# Patient Record
Sex: Female | Born: 1980 | Race: Black or African American | Hispanic: No | Marital: Single | Smoking: Former smoker
Health system: Southern US, Community
[De-identification: ages and names within clinical notes are randomized; demographics above are authoritative.]

## PROBLEM LIST (undated history)

## (undated) DIAGNOSIS — R011 Cardiac murmur, unspecified: Secondary | ICD-10-CM

## (undated) HISTORY — PX: WISDOM TOOTH EXTRACTION: SHX21

## (undated) HISTORY — DX: Cardiac murmur, unspecified: R01.1

## (undated) HISTORY — PX: NO PAST SURGERIES: SHX2092

---

## 2009-03-30 DIAGNOSIS — G35 Multiple sclerosis: Secondary | ICD-10-CM | POA: Insufficient documentation

## 2009-03-30 HISTORY — DX: Multiple sclerosis: G35

## 2013-01-04 DIAGNOSIS — F330 Major depressive disorder, recurrent, mild: Secondary | ICD-10-CM

## 2013-01-04 DIAGNOSIS — G479 Sleep disorder, unspecified: Secondary | ICD-10-CM

## 2013-01-04 DIAGNOSIS — F32A Depression, unspecified: Secondary | ICD-10-CM | POA: Insufficient documentation

## 2013-01-04 HISTORY — DX: Major depressive disorder, recurrent, mild: F33.0

## 2013-01-04 HISTORY — DX: Sleep disorder, unspecified: G47.9

## 2013-01-04 HISTORY — DX: Depression, unspecified: F32.A

## 2016-12-08 DIAGNOSIS — F319 Bipolar disorder, unspecified: Secondary | ICD-10-CM

## 2016-12-08 DIAGNOSIS — E039 Hypothyroidism, unspecified: Secondary | ICD-10-CM

## 2016-12-08 DIAGNOSIS — G2581 Restless legs syndrome: Secondary | ICD-10-CM | POA: Insufficient documentation

## 2016-12-08 HISTORY — DX: Bipolar disorder, unspecified: F31.9

## 2016-12-08 HISTORY — DX: Hypothyroidism, unspecified: E03.9

## 2016-12-08 HISTORY — DX: Restless legs syndrome: G25.81

## 2017-02-12 DIAGNOSIS — F419 Anxiety disorder, unspecified: Secondary | ICD-10-CM

## 2017-02-12 DIAGNOSIS — G43009 Migraine without aura, not intractable, without status migrainosus: Secondary | ICD-10-CM | POA: Insufficient documentation

## 2017-02-12 HISTORY — DX: Anxiety disorder, unspecified: F41.9

## 2017-02-12 HISTORY — DX: Migraine without aura, not intractable, without status migrainosus: G43.009

## 2017-04-29 DIAGNOSIS — R42 Dizziness and giddiness: Secondary | ICD-10-CM | POA: Insufficient documentation

## 2017-04-29 HISTORY — DX: Dizziness and giddiness: R42

## 2017-05-27 DIAGNOSIS — R258 Other abnormal involuntary movements: Secondary | ICD-10-CM | POA: Insufficient documentation

## 2017-05-27 DIAGNOSIS — R251 Tremor, unspecified: Secondary | ICD-10-CM | POA: Insufficient documentation

## 2017-05-27 HISTORY — DX: Other abnormal involuntary movements: R25.8

## 2017-05-27 HISTORY — DX: Tremor, unspecified: R25.1

## 2017-06-24 DIAGNOSIS — M5030 Other cervical disc degeneration, unspecified cervical region: Secondary | ICD-10-CM

## 2017-06-24 DIAGNOSIS — M5020 Other cervical disc displacement, unspecified cervical region: Secondary | ICD-10-CM | POA: Insufficient documentation

## 2017-06-24 HISTORY — DX: Other cervical disc degeneration, unspecified cervical region: M50.30

## 2017-10-05 DIAGNOSIS — M5136 Other intervertebral disc degeneration, lumbar region: Secondary | ICD-10-CM

## 2017-10-05 DIAGNOSIS — G8929 Other chronic pain: Secondary | ICD-10-CM | POA: Insufficient documentation

## 2017-10-05 DIAGNOSIS — M25561 Pain in right knee: Secondary | ICD-10-CM | POA: Insufficient documentation

## 2017-10-05 DIAGNOSIS — M25562 Pain in left knee: Secondary | ICD-10-CM

## 2017-10-05 HISTORY — DX: Other intervertebral disc degeneration, lumbar region: M51.36

## 2017-10-05 HISTORY — DX: Pain in right knee: M25.562

## 2017-10-05 HISTORY — DX: Other chronic pain: G89.29

## 2018-11-18 ENCOUNTER — Other Ambulatory Visit: Payer: Self-pay | Admitting: Orthopedic Surgery

## 2018-11-18 DIAGNOSIS — M545 Low back pain, unspecified: Secondary | ICD-10-CM

## 2018-11-18 DIAGNOSIS — G8929 Other chronic pain: Secondary | ICD-10-CM

## 2018-12-02 ENCOUNTER — Inpatient Hospital Stay
Admission: RE | Admit: 2018-12-02 | Discharge: 2018-12-02 | Disposition: A | Discharge status: Home / Self Care | Payer: Self-pay | Source: Ambulatory Visit | Attending: Orthopedic Surgery | Admitting: Orthopedic Surgery

## 2018-12-02 NOTE — Discharge Instructions (Signed)

## 2018-12-09 ENCOUNTER — Ambulatory Visit
Admission: RE | Admit: 2018-12-09 | Discharge: 2018-12-09 | Disposition: A | Discharge status: Home / Self Care | Payer: Self-pay | Source: Ambulatory Visit | Attending: Orthopedic Surgery | Admitting: Orthopedic Surgery

## 2018-12-09 ENCOUNTER — Other Ambulatory Visit: Payer: Self-pay | Admitting: Orthopedic Surgery

## 2018-12-09 DIAGNOSIS — M545 Low back pain, unspecified: Secondary | ICD-10-CM

## 2018-12-09 DIAGNOSIS — G8929 Other chronic pain: Secondary | ICD-10-CM

## 2018-12-09 NOTE — Discharge Instructions (Signed)

## 2018-12-12 ENCOUNTER — Ambulatory Visit
Admission: RE | Admit: 2018-12-12 | Discharge: 2018-12-12 | Disposition: A | Discharge status: Home / Self Care | Payer: Medicaid Other | Source: Ambulatory Visit | Attending: Orthopedic Surgery | Admitting: Orthopedic Surgery

## 2018-12-12 ENCOUNTER — Other Ambulatory Visit: Payer: Self-pay

## 2018-12-12 DIAGNOSIS — G8929 Other chronic pain: Secondary | ICD-10-CM

## 2018-12-12 MED ORDER — METHYLPREDNISOLONE ACETATE 40 MG/ML INJ SUSP (RADIOLOG
120.0000 mg | Freq: Once | INTRAMUSCULAR | Status: AC
  Administered 2018-12-12: 09:00:00 120 mg via INTRALESIONAL

## 2018-12-12 MED ORDER — IOPAMIDOL (ISOVUE-M 200) INJECTION 41%
1.0000 mL | Freq: Once | INTRAMUSCULAR | Status: AC
  Administered 2018-12-12: 09:00:00 1 mL

## 2018-12-12 MED ORDER — CEFAZOLIN SODIUM-DEXTROSE 2-4 GM/100ML-% IV SOLN
2.0000 g | Freq: Once | INTRAVENOUS | Status: AC
  Administered 2018-12-12: 2 g via INTRAVENOUS

## 2019-11-22 DIAGNOSIS — F411 Generalized anxiety disorder: Secondary | ICD-10-CM | POA: Insufficient documentation

## 2019-11-22 HISTORY — DX: Generalized anxiety disorder: F41.1

## 2020-11-04 ENCOUNTER — Ambulatory Visit (INDEPENDENT_AMBULATORY_CARE_PROVIDER_SITE_OTHER): Payer: Medicaid Other | Admitting: Podiatry

## 2020-11-04 DIAGNOSIS — Z5329 Procedure and treatment not carried out because of patient's decision for other reasons: Secondary | ICD-10-CM

## 2020-11-14 ENCOUNTER — Other Ambulatory Visit: Payer: Self-pay

## 2020-11-14 ENCOUNTER — Ambulatory Visit: Payer: Medicaid Other | Admitting: Podiatry

## 2020-11-14 ENCOUNTER — Encounter: Payer: Self-pay | Admitting: Podiatry

## 2020-11-14 DIAGNOSIS — B353 Tinea pedis: Secondary | ICD-10-CM | POA: Diagnosis not present

## 2020-11-14 DIAGNOSIS — B351 Tinea unguium: Secondary | ICD-10-CM | POA: Diagnosis not present

## 2020-11-14 MED ORDER — FLUCONAZOLE 150 MG PO TABS: 150.0000 mg | ORAL_TABLET | ORAL | 2 refills | Status: DC

## 2020-11-14 NOTE — Progress Notes (Signed)
  Subjective:  Patient ID: Karen Leonard, female    DOB: [birth year],  MRN: [id number]  Chief Complaint  Patient presents with   Nail Problem    I have some toenails that are thick and discolored and there is some itchy in between the toes    40 y.o. female presents with the above complaint. History confirmed with patient.   Objective:  Physical Exam: warm, good capillary refill, no trophic changes or ulcerative lesions, normal DP and PT pulses, and normal sensory exam  Right Foot: right hallux nail dystrophy, brown discoloration, transverse ridging. Xerosis with scaling plantar foot  Macerated webspaces 2-4 bilaterally    Assessment:   1. Tinea pedis of both feet   2. Onychomycosis      Plan:  Patient was evaluated and treated and all questions answered.  Tinea Pedis/Onychomycosis -Educated on etiology -Likely traumatic permanent changes to right hallux. Debrided today -Recc topical isopropyl alcohol to interspaces daily to keep them dry. Castellani's paint applied today. -Start fluconazole weekly.  Return in about 2 months (around 01/14/2021).

## 2020-12-18 ENCOUNTER — Telehealth: Payer: Self-pay | Admitting: Podiatry

## 2020-12-18 ENCOUNTER — Other Ambulatory Visit: Payer: Self-pay | Admitting: Sports Medicine

## 2020-12-18 MED ORDER — FLUCONAZOLE 150 MG PO TABS: 150.0000 mg | ORAL_TABLET | ORAL | 2 refills | Status: DC

## 2020-12-18 NOTE — Progress Notes (Signed)
Reordered Fluconazole for patient

## 2020-12-18 NOTE — Telephone Encounter (Signed)
Pt was given rx for fluconazole and states she lost it-would like to know if this can be re-written.  Dr. Samuella Cota pt

## 2020-12-19 NOTE — Telephone Encounter (Signed)
Lm to notify pt/reb 

## 2021-01-20 ENCOUNTER — Other Ambulatory Visit: Payer: Self-pay

## 2021-01-20 ENCOUNTER — Ambulatory Visit: Payer: Medicaid Other | Admitting: Podiatry

## 2021-01-20 DIAGNOSIS — B353 Tinea pedis: Secondary | ICD-10-CM

## 2021-01-20 DIAGNOSIS — B351 Tinea unguium: Secondary | ICD-10-CM

## 2021-01-20 MED ORDER — FLUCONAZOLE 150 MG PO TABS: 150.0000 mg | ORAL_TABLET | ORAL | 0 refills | Status: DC

## 2021-01-20 NOTE — Progress Notes (Signed)
  Subjective:  Patient ID: Karen Leonard, female    DOB: [birth year],  MRN: [id number]  Chief Complaint  Patient presents with   Tinea Pedis    F/U BL tinea and Rt 1st fungus -pt states," much better inb/w toes and nail looks same." -pt completed fluconazole    40 y.o. female presents with the above complaint. History confirmed with patient.   Objective:  Physical Exam: warm, good capillary refill, no trophic changes or ulcerative lesions, normal DP and PT pulses, and normal sensory exam  Right Foot: right hallux nail dystrophy, brown discoloration, transverse ridging but proximal clearing. Xerosis with scaling plantar foot  Macerated webspace left 4th interspace  Assessment:   1. Onychomycosis   2. Tinea pedis of both feet    Plan:  Patient was evaluated and treated and all questions answered.  Tinea Pedis/Onychomycosis -Educated on etiology -Right hallux is improving. Interdigital component almost resolved. Continue fluconazole for total 3 months of therapy. Refilled today. Debrided nail   No follow-ups on file.

## 2021-02-08 IMAGING — XA DG DISKOGRAPHY LUMBAR S+I
2 series · 2 of 2 positions shown · non-contrast
Comparison: Outside MRI

CLINICAL DATA: Degenerative disc disease at L5-S1 with low back
pain. Clinical request for steroid and anesthetic injection.

EXAM:
L5-S1 LUMBAR DISK INJECTION

[Series 1: ortho adipose · 1 of 1 slices shown (1 of 2)]
[im 1/1]
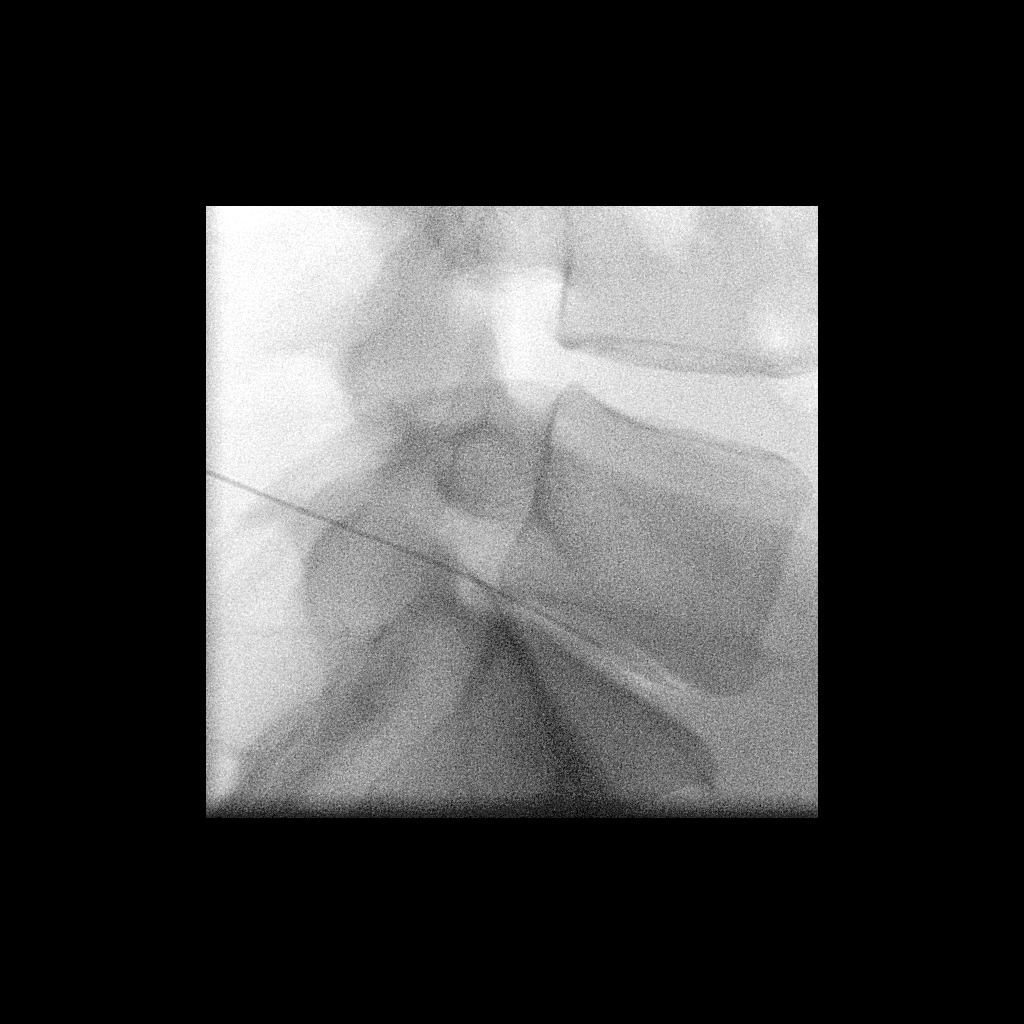

[Series 2: ortho adipose · 1 of 1 slices shown (2 of 2)]
[im 1/1]
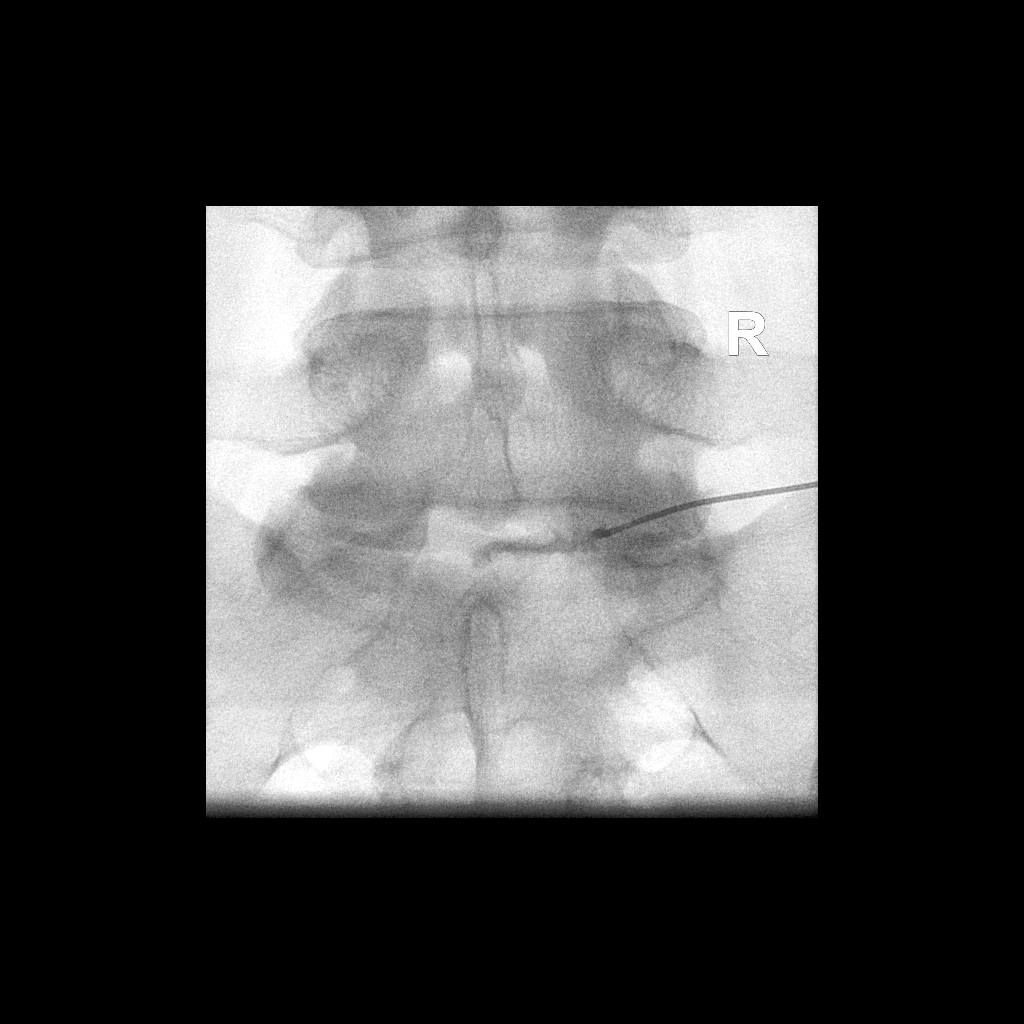

[2 of 2 positions shown; findings below may reference images not displayed]

PROCEDURE:
The procedure was discussed in depth with the patient including the
potential risk of infection. She received 1 gram of cefazolin
intravenously prior to the procedure. She was placed prone on the
fluoroscopic table. A Betadine scrub of the low back was performed
and the patient was draped in a sterile fashion. Skin anesthesia was
carried [DATE]%Lidocaine. Using a right sided oblique approach,
a 20 cm 22 gauge Chiba needle was directed into the nuclear region
of the disc at L5-S1. A few drops of Isovue 200 was injected to
confirm nuclear position. 1.5 cc consisting of 60 mg Depo-Medrol and
0.75 cc 0.5% bupivacaine were injected.

FLUOROSCOPY TIME:  1 minutes 4 seconds. 86.38 micro gray meter
squared
IMPRESSION: Technically successful L5-S1 disc injection with Depo-Medrol and
bupivacaine as above.

## 2021-02-24 ENCOUNTER — Ambulatory Visit (INDEPENDENT_AMBULATORY_CARE_PROVIDER_SITE_OTHER): Payer: Medicaid Other | Admitting: Podiatry

## 2021-02-24 DIAGNOSIS — Z91199 Patient's noncompliance with other medical treatment and regimen due to unspecified reason: Secondary | ICD-10-CM

## 2021-10-23 ENCOUNTER — Encounter: Payer: Self-pay | Admitting: Cardiology

## 2021-10-23 ENCOUNTER — Encounter: Payer: Self-pay | Admitting: *Deleted

## 2021-10-31 ENCOUNTER — Ambulatory Visit: Payer: Medicaid Other | Admitting: Cardiology

## 2021-11-19 ENCOUNTER — Ambulatory Visit: Payer: Medicaid Other | Admitting: Cardiology

## 2021-11-19 ENCOUNTER — Encounter: Payer: Self-pay | Admitting: Cardiology

## 2021-11-19 ENCOUNTER — Ambulatory Visit (INDEPENDENT_AMBULATORY_CARE_PROVIDER_SITE_OTHER): Payer: Medicaid Other

## 2021-11-19 VITALS — BP 108/72 | HR 72 | Ht 62.0 in | Wt 145.8 lb

## 2021-11-19 DIAGNOSIS — E782 Mixed hyperlipidemia: Secondary | ICD-10-CM

## 2021-11-19 DIAGNOSIS — R011 Cardiac murmur, unspecified: Secondary | ICD-10-CM

## 2021-11-19 DIAGNOSIS — R002 Palpitations: Secondary | ICD-10-CM

## 2021-11-19 DIAGNOSIS — R079 Chest pain, unspecified: Secondary | ICD-10-CM

## 2021-11-19 HISTORY — DX: Mixed hyperlipidemia: E78.2

## 2021-11-19 HISTORY — DX: Palpitations: R00.2

## 2021-11-19 HISTORY — DX: Chest pain, unspecified: R07.9

## 2021-11-19 NOTE — Progress Notes (Signed)
Cardiology Office Note:    Date:  11/19/2021   ID:  Karen Leonard, DOB [birth year], MRN [id number]  PCP:  Harlen Labs, NP  Cardiologist:  Garwin Brothers, MD   Referring MD: Harlen Labs, NP    ASSESSMENT:    1. Heart murmur   2. Palpitations   3. Mixed dyslipidemia   4. Chest pain of uncertain etiology    PLAN:    In order of problems listed above:  Primary prevention stressed with the patient.  Importance of compliance with diet and medication stressed and she vocalized understanding. Chest pain: Atypical in nature she wants to be evaluated and we will do an exercise stress echo.  She is agreeable.  She has multiple risk factors for coronary artery disease. Palpitations: Unclear about the etiology and 2-week monitor will help assess this.  She gets palpitations every 2 to 3 days so hopefully this will be helpful.  She tells me that her thyroid function is normal.  I also reviewed [facility] hospital records. Mixed dyslipidemia: Diet was emphasized and diet sheet was given for low-cholesterol diet.  Risks explained.  Her she had questions which were answered to her satisfaction. Patient will be seen in follow-up appointment in 6 months or earlier if the patient has any concerns    Medication Adjustments/Labs and Tests Ordered: Current medicines are reviewed at length with the patient today.  Concerns regarding medicines are outlined above.  Orders Placed This Encounter  Procedures   LONG TERM MONITOR (3-14 DAYS)   EKG 12-Lead   ECHOCARDIOGRAM STRESS TEST   No orders of the defined types were placed in this encounter.    History of Present Illness:    Karen Leonard is a 41 y.o. female who is being seen today for the evaluation of palpitations and cardiac murmur at the request of Harlen Labs, NP.  Patient is a pleasant 41 year old female.  She has past medical history of palpitations.  She mentions to me that she had an episode of syncope on Mother's  Day.  Subsequently she has never had any problems with that.  She is followed by primary care for this.  Today she is here because of palpitations.  She wants to be evaluated.  No dizzy spells or syncope or palpitations.  No chest pain orthopnea or PND.  At the time of my evaluation, the patient is alert awake oriented and in no distress.  She occasionally has chest pain but this is not related to exertion.  She is concerned about it and wants evaluation.  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   Multiple Vitamin (MULTI-VITAMIN PO) Take 1 tablet by mouth daily at 12 noon.   Probiotic Product (PROBIOTIC DAILY PO) Take 1 tablet by mouth daily at 12 noon.   UNABLE TO FIND C Moss Med Name: 1-2 tablets daily     Allergies:   Aripiprazole, Glatiramer, Nickel, Codeine, Fingolimod, Lithium, and Ropinirole  Social History   Socioeconomic History   Marital status: Single    Spouse name: Not on file   Number of children: Not on file   Years of education: Not on file   Highest education level: Not on file  Occupational History   Not on file  Tobacco Use   Smoking status: Former    Types: Cigarettes    Quit date: 2016    Years since quitting: 7.6    Passive exposure: Past   Smokeless tobacco: Never  Vaping Use   Vaping Use: Former  Substance and Sexual Activity   Alcohol use: Not Currently   Drug use: Not Currently   Sexual activity: Not on  file  Other Topics Concern   Not on file  Social History Narrative   Not on file   Social Determinants of Health   Financial Resource Strain: Not on file  Food Insecurity: Not on file  Transportation Needs: Not on file  Physical Activity: Not on file  Stress: Not on file  Social Connections: Not on file     Family History: The patient's family history includes Anxiety disorder in her maternal grandmother and mother; Cancer in her mother; Coronary artery disease in her father; Depression in her mother; Healthy in her sister; Heart attack in her father and paternal grandmother; Heart failure in her maternal grandmother; Migraines in her mother; Parkinsonism in her paternal uncle; Stroke in her maternal aunt, maternal uncle, paternal aunt, paternal grandfather, and paternal uncle; Thyroid disease in her sister.  ROS:   Please see the history of present illness.    All other systems reviewed and are negative.  EKGs/Labs/Other Studies Reviewed:    The following studies were reviewed today: EKG reveals sinus rhythm and nonspecific ST-T changes   Recent Labs: No results found for requested labs within last 365 days.  Recent Lipid Panel No results found for: "CHOL", "TRIG", "HDL", "CHOLHDL", "VLDL", "LDLCALC", "LDLDIRECT"  Physical Exam:    VS:  BP 108/72 (BP Location: Left Arm, Patient Position: Sitting)   Pulse 72   Ht 5\' 2"  (1.575 m)   Wt 145 lb 12.8 oz (66.1 kg)   SpO2 99%   BMI 26.67 kg/m     Wt Readings from Last 3 Encounters:  11/19/21 145 lb 12.8 oz (66.1 kg)  09/23/21 143 lb (64.9 kg)     GEN: Patient is in no acute distress HEENT: Normal NECK: No JVD; No carotid bruits LYMPHATICS: No lymphadenopathy CARDIAC: S1 S2 regular, 2/6 systolic murmur at the apex. RESPIRATORY:  Clear to auscultation without rales, wheezing or rhonchi  ABDOMEN: Soft, non-tender, non-distended MUSCULOSKELETAL:  No edema; No deformity  SKIN: Warm and dry NEUROLOGIC:  Alert and oriented  x 3 PSYCHIATRIC:  Normal affect    Signed, 09/25/21, MD  11/19/2021 4:20 PM    [facility]

## 2021-11-19 NOTE — Patient Instructions (Signed)
Medication Instructions:  Your physician recommends that you continue on your current medications as directed. Please refer to the Current Medication list given to you today.  *If you need a refill on your cardiac medications before your next appointment, please call your pharmacy*   Lab Work: None ordered If you have labs (blood work) drawn today and your tests are completely normal, you will receive your results only by: MyChart Message (if you have MyChart) OR A paper copy in the mail If you have any lab test that is abnormal or we need to change your treatment, we will call you to review the results.   Testing/Procedures: Stress Echocardiogram Information Sheet Instructions:  This test will be done at [facility].  Nothing to eat or drink after midnight.  Dress prepared to exercise.  Please bring all current prescription medications.    WHY IS MY DOCTOR PRESCRIBING ZIO? The Zio system is proven and trusted by physicians to detect and diagnose irregular heart rhythms -- and has been prescribed to hundreds of thousands of patients.  The FDA has cleared the Zio system to monitor for many different kinds of irregular heart rhythms. In a study, physicians were able to reach a diagnosis 90% of the time with the Zio system1.  You can wear the Zio monitor -- a small, discreet, comfortable patch -- during your normal day-to-day activity, including while you sleep, shower, and exercise, while it records every single heartbeat for analysis.  1Barrett, P., et al. Comparison of 24 Hour Holter Monitoring Versus 14 Day Novel Adhesive Patch Electrocardiographic Monitoring. American Journal of Medicine, 2014.  ZIO VS. HOLTER MONITORING The Zio monitor can be comfortably worn for up to 14 days. Holter monitors can be worn for 24 to 48 hours, limiting the time to record any irregular heart rhythms you may have. Zio is able to capture data for the 51% of patients who have their first  symptom-triggered arrhythmia after 48 hours.1  LIVE WITHOUT RESTRICTIONS The Zio ambulatory cardiac monitor is a small, unobtrusive, and water-resistant patch--you might even forget you're wearing it. The Zio monitor records and stores every beat of your heart, whether you're sleeping, working out, or showering. Wear the monitor for 14 days, remove 12/03/21.   Follow-Up: At [facility], you and your health needs are our priority.  As part of our continuing mission to provide you with exceptional heart care, we have created designated Provider Care Teams.  These Care Teams include your primary Cardiologist (physician) and Advanced Practice Providers (APPs -  Physician Assistants and Nurse Practitioners) who all work together to provide you with the care you need, when you need it.  We recommend signing up for the patient portal called "MyChart".  Sign up information is provided on this After Visit Summary.  MyChart is used to connect with patients for Virtual Visits (Telemedicine).  Patients are able to view lab/test results, encounter notes, upcoming appointments, etc.  Non-urgent messages can be sent to your provider as well.   To learn more about what you can do with MyChart, go to [email or web address].    Your next appointment:   6 month(s)  The format for your next appointment:   In Person  Provider:   Belva Crome, MD   Other Instructions Exercise Stress Echocardiogram An exercise stress echocardiogram is a test to check how well your heart is working. This test uses sound waves (ultrasound) and a computer to make images of your heart before and after exercise. Ultrasound images that  are taken before you exercise (resting echocardiogram) will show how much blood is getting to your heart muscle and how well yourheart muscle and heart valves are functioning. During the next part of this test, you will walk on a treadmill or ride a stationary bicycle to see how exercise affects your  heart. While you exercise, the electrical activity of your heart will be monitored with anelectrocardiogram (ECG). Your blood pressure will also be monitored. You may have this test if you have: Chest pain or other symptoms of a heart problem. Recently had a heart attack or heart surgery. Heart valve problems. A condition that causes narrowing of the blood vessels that supply your heart (coronary artery disease). A high risk of heart disease and are starting a new exercise program. A high risk of heart disease and need to have major surgery. Heart arrhythmia (abnormal heart rhythm) problems. Heart failure problems. Tell a health care provider about: Any allergies you have. All medicines you are taking, including vitamins, herbs, eye drops, creams, and over-the-counter medicines. Any problems you or family members have had with anesthetic medicines. Any surgeries you have had. Any blood disorders you have. Any medical conditions you have. Whether you are pregnant or may be pregnant. What are the risks? Generally, this is a safe test. However, problems may occur, including: Chest pain. Dizziness or light-headedness. Shortness of breath. Increased or irregular heartbeat (palpitations). Nausea or vomiting. Heart attack. This is very rare. What happens before the test? Medicines Ask your health care provider about changing or stopping your regular medicines. This is especially important if you are taking diabetes medicines or blood thinners. If you use an inhaler, bring it with you to the test. General instructions Wear loose, comfortable clothing and walking shoes. Follow instructions from your health care provider about eating or drinking restrictions. You may be asked to avoid all forms of caffeine for 24 hours before your test, or as told by your health care provider. Do not use any products that contain nicotine or tobacco for 4 hours before the test, or as told by your health care  provider. These products include cigarettes, chewing tobacco, and vaping devices, such as e-cigarettes. If you need help quitting, ask your health care provider. What happens during the test?  You will take off your clothes from the waist up and put on a hospital gown. Electrodes or electrocardiogram (ECG) patches may be placed on your chest. The electrodes or patches are then connected to a device that monitors your heart rate and rhythm. A blood pressure cuff will be placed on your arm. You will lie down on a table for an ultrasound exam before you exercise. A gel will be applied to your chest to help sound waves pass through your skin. A handheld device, called a transducer, will be pressed against your chest and moved over your heart. The transducer produces sound waves that travel to your heart and bounce back (or "echo" back) to the transducer. These sound waves will be captured in real-time and changed into images of your heart that can be viewed on a video monitor. The images will be recorded on a computer and reviewed by your health care provider. Once the ultrasound is complete, you will start exercising by walking on a treadmill or pedaling a stationary bicycle. Your blood pressure and heart rhythm will be monitored while you exercise. The exercise will gradually get harder or faster. You will exercise until: Your heart reaches a target level. You are too  tired to continue. You cannot continue because of chest pain, weakness, or dizziness. You will have another ultrasound exam immediately after you stop exercising. The procedure may vary among health care providers and hospitals. What can I expect after the test? After your test, it is common to have: Mild soreness. Mild fatigue. Your heart rate and blood pressure will be monitored until they return to yournormal levels. You should not have any new symptoms after this test. Follow these instructions at home: After your stress test,  you should be able to return to your normal activities and diet. Take over-the-counter and prescription medicines only as told by your health care provider. Keep all follow-up visits. This is important. It is up to you to get the results of your test. Ask your health care provider, or the department that is doing the test, when your results will be ready. Contact a health care provider if you: Feel dizzy or light-headed. Have a fast or irregular heartbeat. Have nausea or vomiting. Have a headache. Feel short of breath. Get help right away if you: Develop pain or pressure: In your chest. In your jaw or neck. Between your shoulder blades. Radiating down your left arm. Faint. Have trouble breathing. These symptoms may represent a serious problem that is an emergency. Do not wait to see if the symptoms will go away. Get medical help right away. Call your local emergency services (911 in the [place] Do not drive yourself to the hospital. Summary An exercise stress echocardiogram is a test that uses ultrasound to check how well your heart works before and after exercise. Before the test, follow instructions from your health care provider about stopping medicines and avoiding caffeine, nicotine and tobacco, and certain foods and drinks. During the test, your blood pressure and heart rhythm will be monitored while you exercise on a treadmill or stationary bicycle. This information is not intended to replace advice given to you by your health care provider. Make sure you discuss any questions you have with your healthcare provider. Document Revised: 11/07/2019 Document Reviewed: 11/07/2019 Elsevier Patient Education  2022 [employer].

## 2021-12-03 DIAGNOSIS — R079 Chest pain, unspecified: Secondary | ICD-10-CM

## 2022-02-09 ENCOUNTER — Other Ambulatory Visit: Payer: Self-pay | Admitting: Student

## 2022-02-10 LAB — COMPREHENSIVE METABOLIC PANEL
ALT: 28 IU/L (ref 0–32)
AST: 16 IU/L (ref 0–40)
Albumin/Globulin Ratio: 2 (ref 1.2–2.2)
Albumin: 4.9 g/dL (ref 3.9–4.9)
Alkaline Phosphatase: 41 IU/L — ABNORMAL LOW (ref 44–121)
BUN/Creatinine Ratio: 19 (ref 9–23)
BUN: 12 mg/dL (ref 6–20)
Bilirubin Total: 0.7 mg/dL (ref 0.0–1.2)
CO2: 22 mmol/L (ref 20–29)
Calcium: 9.6 mg/dL (ref 8.7–10.2)
Chloride: 104 mmol/L (ref 96–106)
Creatinine, Ser: 0.62 mg/dL (ref 0.57–1.00)
Globulin, Total: 2.4 g/dL (ref 1.5–4.5)
Glucose: 86 mg/dL (ref 70–99)
Potassium: 4.7 mmol/L (ref 3.5–5.2)
Sodium: 142 mmol/L (ref 134–144)
Total Protein: 7.3 g/dL (ref 6.0–8.5)
eGFR: 116 mL/min/{1.73_m2} (ref >59)

## 2022-02-10 LAB — LIPID PANEL W/O CHOL/HDL RATIO
Cholesterol, Total: 224 mg/dL — ABNORMAL HIGH (ref 100–199)
HDL: 97 mg/dL (ref >39)
LDL Chol Calc (NIH): 117 mg/dL — ABNORMAL HIGH (ref 0–99)
Triglycerides: 55 mg/dL (ref 0–149)
VLDL Cholesterol Cal: 10 mg/dL (ref 5–40)

## 2022-05-21 ENCOUNTER — Other Ambulatory Visit: Payer: Self-pay

## 2022-05-27 ENCOUNTER — Ambulatory Visit: Payer: Medicaid Other

## 2022-05-27 ENCOUNTER — Ambulatory Visit: Payer: Medicaid Other | Attending: Cardiology | Admitting: Cardiology

## 2022-05-27 NOTE — Progress Notes (Signed)
Pt walked in requesting a Nurse BP Check. Pt states that she felt lightheadedness, dizziness and SHOB starting today.  Pt states having a hx of anxiety and panic attacks as well as being noted in her chart. Pt had dropped daughter off at a doctors appt nearby and had come by for the nurse BP check. Pt's vital were as noted. Vital were shown to Dr, Reesa Chew and per Dr. Reesa Chew we could pt on to schedule to be seen. Pt declined due to having to go pick up daughter at doctors appt but would come back if before 12 to be see. Pt called back stating that she was going to go home since she felt better. Pt stated she would call back when we opened if symptoms got worse to be added on for this afternoon. Informed pt to call back and if symptoms worsen to call EMS.

## 2022-05-28 ENCOUNTER — Encounter: Payer: Self-pay | Admitting: Cardiology

## 2022-07-09 ENCOUNTER — Ambulatory Visit: Payer: Medicaid Other | Admitting: Neurology

## 2022-07-16 ENCOUNTER — Ambulatory Visit: Payer: Medicaid Other | Admitting: Neurology

## 2022-09-01 ENCOUNTER — Ambulatory Visit: Payer: Medicaid Other | Admitting: Student

## 2022-09-01 ENCOUNTER — Encounter: Payer: Self-pay | Admitting: Student

## 2022-09-01 VITALS — BP 104/76 | HR 79 | Temp 97.9°F | Resp 16 | Ht 62.0 in | Wt 138.0 lb

## 2022-09-01 DIAGNOSIS — G35 Multiple sclerosis: Secondary | ICD-10-CM | POA: Diagnosis not present

## 2022-09-01 DIAGNOSIS — R42 Dizziness and giddiness: Secondary | ICD-10-CM | POA: Diagnosis not present

## 2022-09-01 NOTE — Progress Notes (Addendum)
Acute Office Visit  Subjective:     Patient ID: Karen Leonard, female    DOB: 27-Dec-1980, 42 y.o.   MRN: [id number]  Chief Complaint  Patient presents with   Dizziness    Happens after drinking juice and happened twice    42 yo female patient here for complaints of dizziness. She reports that on May 30 th she was fasting  over a 36 hour period with only water and herbal tea. When she broke the fast she became lightheaded and dizzy after consuming fresh watermelon juice with ginger, lime, and strawberries. She laid down for approx 1-2 hrs, she got up and went to the store and began drinking another juice of the same contents when her symptoms came back again and she had to pull over. She then went to [facility] and had a work up. Details will be entered into this note. She states that "I fast often, at least a few months at a time to detox and purge." She speaks of a spiritual //personal dealing with past traumas and emotions. She acclimates this to her having MS. She reports that she does not take any daily medications.  At the ER visit she was prescribed Ativan 1 mg #30 tablet.   EKG Time: 20:51 -: Yes EKG interpreted by me Rate: bpm: 76 Axis: Normal Rhythm: NSR Block: None Hypertrophy: None ST: Normal  PORTABLE CHEST 1 VIEW COMPARISON: PA Lat 10/23/2016 FINDINGS: The heart size and mediastinal contours are within normal limits. Both lungs are clear. The visualized skeletal structures are unremarkable. IMPRESSION: No active disease. Stable chest.  CT abdomen and pelvis with contrast  IMPRESSION: No acute process demonstrated in the abdomen or pelvis. No evidence of bowel obstruction or inflammation.  Diagnosis: Biliary colic & Atypical Chest pain     Review of Systems  Constitutional:  Negative for chills and fever.  HENT: Negative.    Eyes:  Negative for blurred vision and double vision.  Respiratory:  Negative for shortness of breath and wheezing.    Cardiovascular:  Negative for chest pain and palpitations.  Gastrointestinal:  Positive for constipation. Negative for diarrhea, nausea and vomiting.  Genitourinary: Negative.   Musculoskeletal: Negative.   Skin: Negative.   Neurological:  Positive for dizziness. Negative for tingling, tremors, loss of consciousness, weakness and headaches (pressure around the jaw and forehead).  Endo/Heme/Allergies: Negative.   Psychiatric/Behavioral:  Negative for depression, substance abuse and suicidal ideas. The patient has insomnia.         Objective:    BP 104/76 (BP Location: Left Arm, Patient Position: Sitting, Cuff Size: Normal)   Pulse 79   Temp 97.9 F (36.6 C) (Temporal)   Resp 16   Ht 5\' 2"  (1.575 m)   Wt 138 lb (62.6 kg)   SpO2 99%   BMI 25.24 kg/m    Physical Exam Constitutional:      Appearance: Normal appearance.  HENT:     Head: Normocephalic.     Mouth/Throat:     Mouth: Mucous membranes are moist.     Pharynx: Oropharynx is clear.  Eyes:     Extraocular Movements:     Right eye: Nystagmus present.  Cardiovascular:     Rate and Rhythm: Normal rate and regular rhythm.     Pulses: Normal pulses.     Heart sounds: Normal heart sounds.  Pulmonary:     Effort: Pulmonary effort is normal.     Breath sounds: Normal breath sounds. No wheezing.  Abdominal:  General: There is no distension.     Palpations: Abdomen is soft.     Tenderness: There is no abdominal tenderness.  Musculoskeletal:        General: Normal range of motion.  Skin:    General: Skin is warm and dry.     Coloration: Skin is not pale.     Findings: No erythema.  Neurological:     Mental Status: She is alert and oriented to person, place, and time.     Motor: Weakness present.  Psychiatric:        Mood and Affect: Mood normal.     No results found for any visits on 09/01/22.      Assessment & Plan:   Problem List Items Addressed This Visit       Nervous and Auditory   Multiple  sclerosis (HCC)    She is supposed to have an appointment with a new Neurologist for her MS. She has not been to see neurology in 3 years.         Other   Episodic lightheadedness - Primary    At this time it is believed that long periods of fasting followed by drinking sugary beverages for hydration to be the cause. She did go to [facility] ER. See notes from visit above.  Today she is fasting again but denies the same symptoms at this appointment.         No orders of the defined types were placed in this encounter.   Return in about 1 month (around 10/01/2022) for annual exam- Fast for labwork .  Edwena Blow, NP

## 2022-09-01 NOTE — Assessment & Plan Note (Addendum)
At this time it is believed that long periods of fasting followed by drinking sugary beverages for hydration to be the cause. She did go to [facility] ER. See notes from visit above.  Today she is fasting again but denies the same symptoms at this appointment.

## 2022-09-01 NOTE — Assessment & Plan Note (Signed)
She is supposed to have an appointment with a new Neurologist for her MS. She has not been to see neurology in 3 years.

## 2022-09-20 NOTE — Progress Notes (Deleted)
[facility]  PATIENT: Karen Leonard DOB: [birth year]  REFERRING DOCTOR OR PCP:  *** SOURCE: ***  _________________________________   HISTORICAL  CHIEF COMPLAINT:  No chief complaint on file.   HISTORY OF PRESENT ILLNESS:  At the pleasure seeing your patient, Karen Leonard, at the [facility] at [facility] for neurologic for multiple sclerosis diagnosed in 2011   She is a 42 year old woman who was diagnosed with MS in 2011.  Initially, she was placed on Copaxone but discontinued after 1 to 2 years due to skin reactions.  Placed on Betaseron for 1 to 2 years and Betaseron.  She was on Gilenya between 2014 and 2018 but discontinued due to headaches and having some progression on the MRI.  She was placed on Tysabri January 2019 but discontinued in March 2021 due to high JCV titer.  She was seen Dr. Alton Revere at [facility] between 2018 and 2022.  ***zeposia? Imaging: By report, MRI of the brain 12/09/2018 was unchanged.  These films were not available for my review.  MRI of the brain 06/24/2017 showed scattered T2/FLAIR hypertense foci in the periventricular and deep white matter.  There were 2 additional foci compared to the 2014 MRI.  None of the foci enhance.  MRI of the brain 12/30/2012 showed a small number of scattered T2/FLAIR hyperintense foci in the periventricular and deep white matter.  None of these enhance.  MRI of the cervical spine 12/30/2012 showed a T2 hyperintense focus towards the left adjacent to C6  MRI of the thoracic spine 12/30/2012 was normal.  REVIEW OF SYSTEMS: Constitutional: No fevers, chills, sweats, or change in appetite Eyes: No visual changes, double vision, eye pain Ear, nose and throat: No hearing loss, ear pain, nasal congestion, sore throat Cardiovascular: No chest pain, palpitations Respiratory:  No shortness of breath at rest or with exertion.   No wheezes GastrointestinaI: No nausea, vomiting, diarrhea,  abdominal pain, fecal incontinence Genitourinary:  No dysuria, urinary retention or frequency.  No nocturia. Musculoskeletal:  No neck pain, back pain Integumentary: No rash, pruritus, skin lesions Neurological: as above Psychiatric: No depression at this time.  No anxiety Endocrine: No palpitations, diaphoresis, change in appetite, change in weigh or increased thirst Hematologic/Lymphatic:  No anemia, purpura, petechiae. Allergic/Immunologic: No itchy/runny eyes, nasal congestion, recent allergic reactions, rashes  ALLERGIES: Allergies  Allergen Reactions   Aripiprazole Other (See Comments)    Insomnia, Night Sweats   Glatiramer Other (See Comments)    Injection site reaction   Nickel Itching and Rash   Codeine Itching   Fingolimod Other (See Comments)    HA/constipation   Lithium Itching   Ropinirole Other (See Comments)    Night sweats    HOME MEDICATIONS:  Current Outpatient Medications:    Multiple Vitamin (MULTI-VITAMIN PO), Take 1 tablet by mouth daily at 12 noon., Disp: , Rfl:    Probiotic Product (PROBIOTIC DAILY PO), Take 1 tablet by mouth daily at 12 noon., Disp: , Rfl:    UNABLE TO FIND, Take 1-2 tablets by mouth daily. C Moss, Disp: , Rfl:   PAST MEDICAL HISTORY: Past Medical History:  Diagnosis Date   Anxiety 02/12/2017   Bilateral knee pain 10/05/2017   Bipolar 1 disorder (HCC) 12/08/2016   Bulging of cervical intervertebral disc 06/24/2017   C4-5, C6-7   Chest pain of uncertain etiology 11/19/2021   Chronic back pain 10/05/2017   Clonus 05/27/2017   ankles   Degeneration of lumbar intervertebral disc 10/05/2017  Depression 01/04/2013   GAD (generalized anxiety disorder) 11/22/2019   Heart murmur    Hypothyroidism 12/08/2016   Migraine without aura and without status migrainosus, not intractable 02/12/2017   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   Mixed dyslipidemia 11/19/2021   Multiple sclerosis (HCC) 03/30/2009    Palpitations 11/19/2021   Restless legs syndrome (RLS) 12/08/2016   Sleeping difficulty 01/04/2013   Tremor 05/27/2017   Vertigo 04/29/2017    PAST SURGICAL HISTORY: Past Surgical History:  Procedure Laterality Date   WISDOM TOOTH EXTRACTION      FAMILY HISTORY: Family History  Problem Relation Age of Onset   Anxiety disorder Mother    Depression Mother    Cancer Mother    Migraines Mother    Heart attack Father    Coronary artery disease Father        Hx cardiac stents   Thyroid disease Sister    Healthy Sister    Stroke Maternal Aunt    Stroke Maternal Uncle    Stroke Paternal Aunt    Parkinsonism Paternal Uncle    Stroke Paternal Uncle    Anxiety disorder Maternal Grandmother    Heart failure Maternal Grandmother    Heart attack Paternal Grandmother    Stroke Paternal Grandfather     SOCIAL HISTORY: Social History   Socioeconomic History   Marital status: Single    Spouse name: Not on file   Number of children: Not on file   Years of education: Not on file   Highest education level: Not on file  Occupational History   Not on file  Tobacco Use   Smoking status: Former    Types: Cigarettes    Quit date: 2016    Years since quitting: 8.4    Passive exposure: Past   Smokeless tobacco: Never  Vaping Use   Vaping Use: Former  Substance and Sexual Activity   Alcohol use: Not Currently   Drug use: Not Currently   Sexual activity: Not on file  Other Topics Concern   Not on file  Social History Narrative   Not on file   Social Determinants of Health   Financial Resource Strain: Not on file  Food Insecurity: Not on file  Transportation Needs: Not on file  Physical Activity: Not on file  Stress: Not on file  Social Connections: Not on file  Intimate Partner Violence: Not on file       PHYSICAL EXAM  There were no vitals filed for this visit.  There is no height or weight on file to calculate BMI.   General: The patient is well-developed and  well-nourished and in no acute distress  HEENT:  Head is [place]/AT.  Sclera are anicteric.  Funduscopic exam shows normal optic discs and retinal vessels.  Neck: No carotid bruits are noted.  The neck is nontender.  Cardiovascular: The heart has a regular rate and rhythm with a normal S1 and S2. There were no murmurs, gallops or rubs.    Skin: Extremities are without rash or  edema.  Musculoskeletal:  Back is nontender  Neurologic Exam  Mental status: The patient is alert and oriented x 3 at the time of the examination. The patient has apparent normal recent and remote memory, with an apparently normal attention span and concentration ability.   Speech is normal.  Cranial nerves: Extraocular movements are full. Pupils are equal, round, and reactive to light and accomodation.  Visual fields are full.  Facial symmetry is present.  There is good facial sensation to soft touch bilaterally.Facial strength is normal.  Trapezius and sternocleidomastoid strength is normal. No dysarthria is noted.  The tongue is midline, and the patient has symmetric elevation of the soft palate. No obvious hearing deficits are noted.  Motor:  Muscle bulk is normal.   Tone is normal. Strength is  5 / 5 in all 4 extremities.   Sensory: Sensory testing is intact to pinprick, soft touch and vibration sensation in all 4 extremities.  Coordination: Cerebellar testing reveals good finger-nose-finger and heel-to-shin bilaterally.  Gait and station: Station is normal.   Gait is normal. Tandem gait is normal. Romberg is negative.   Reflexes: Deep tendon reflexes are symmetric and normal bilaterally.   Plantar responses are flexor.    DIAGNOSTIC DATA (LABS, IMAGING, TESTING) - I reviewed patient records, labs, notes, testing and imaging myself where available.  No results found for: "WBC", "HGB", "HCT", "MCV", "PLT"    Component Value Date/Time   NA 142 02/09/2022 0000   K 4.7 02/09/2022 0000   CL 104 02/09/2022 0000    CO2 22 02/09/2022 0000   GLUCOSE 86 02/09/2022 0000   BUN 12 02/09/2022 0000   CREATININE 0.62 02/09/2022 0000   CALCIUM 9.6 02/09/2022 0000   PROT 7.3 02/09/2022 0000   ALBUMIN 4.9 02/09/2022 0000   AST 16 02/09/2022 0000   ALT 28 02/09/2022 0000   ALKPHOS 41 (L) 02/09/2022 0000   BILITOT 0.7 02/09/2022 0000   Lab Results  Component Value Date   CHOL 224 (H) 02/09/2022   HDL 97 02/09/2022   LDLCALC 117 (H) 02/09/2022   TRIG 55 02/09/2022   No results found for: "HGBA1C" No results found for: "VITAMINB12" No results found for: "TSH"     ASSESSMENT AND PLAN  ***   Jamelyn Bovard A. Epimenio Foot, MD, [facility] 09/20/2022, 6:35 PM Certified in Neurology, Clinical Neurophysiology, Sleep Medicine and Neuroimaging  [facility] [address], [address] [place], [place] [address] [phone]

## 2022-09-22 ENCOUNTER — Ambulatory Visit: Payer: Medicaid Other | Admitting: Neurology

## 2022-10-06 ENCOUNTER — Encounter: Payer: Medicaid Other | Admitting: Student

## 2022-10-07 ENCOUNTER — Ambulatory Visit: Payer: Medicaid Other | Admitting: Neurology

## 2022-10-13 ENCOUNTER — Telehealth: Payer: Self-pay | Admitting: Neurology

## 2022-10-13 ENCOUNTER — Ambulatory Visit: Payer: Medicaid Other | Admitting: Neurology

## 2022-10-13 NOTE — Telephone Encounter (Signed)
Pt cancelled appt due to transportation issues. 

## 2022-10-15 ENCOUNTER — Encounter: Payer: Medicaid Other | Admitting: Student

## 2022-10-23 DIAGNOSIS — S83282A Other tear of lateral meniscus, current injury, left knee, initial encounter: Secondary | ICD-10-CM

## 2022-10-25 NOTE — Progress Notes (Deleted)
[facility]  PATIENT: Karen Leonard DOB: [birth year]  REFERRING DOCTOR OR PCP:  *** SOURCE: ***  _________________________________   HISTORICAL  CHIEF COMPLAINT:  No chief complaint on file.   HISTORY OF PRESENT ILLNESS:  I had the pleasure seeing your patient, Karen Leonard, at [facility] for neurologic consultation regarding her multiple sclerosis.  She is a 42 year old woman who was diagnosed with MS in 2011 after presenting with vertigo.   She also reports headaches  MS history: She was diagnosed in 2011 after presenting with vertigo and found to have MRI changes consistent with MS.  She was initially placed on Copaxone, then [employer].  She stopped Gilenya due to progression and was switched to Tysabri in January 2019 but stopped in March 2021 due to elevated JCV titer.  She saw Dr. Renne Crigler oral in [facility] between September 2018 and 2021.  Before that, she was seen [facility] neurology in [facility].   Imaging: MRI lumbar spine 06/20/2022 shows: At L4-L5, disc protrusion more to the left causing moderate left foraminal narrowing with some potential to affect the left L4 nerve root.  Additionally there is a small disc protrusion at L5-S1 causing moderate right and mild left lateral recess stenosis but no definite nerve root compression..  The disc is progressed compared to the 2019 MRI  MRI brain 12/08/2018 (report, images not available) showed multiple T2/FLAIR hyperintense foci reportedly stable compared to March 2019.  MRI 06/24/2017 cervical spine (report, images not available) was read as showing a normal spinal cord and minimal disc bulges at C4-C5 and C6-C7.  MRI of the brain 10/20/2016 shows some scattered T2/FLAIR hyperintense foci in the cerebral hemispheres including a couple in the periventricular white matter.  There is a focus in the anterior right thalamus that was not present MRI from 12/30/2012.  And other focus in the  left frontal lobe is new compared to the 2014 MRI.  MRI of the cervical spine 12/30/2012 and 11/19/2011 shows small T2 hyperintense foci within the spinal cord adjacent to C5 and C6 towards the left.  MRI of the thoracic spine 12/30/2012 was normal.   REVIEW OF SYSTEMS: Constitutional: No fevers, chills, sweats, or change in appetite Eyes: No visual changes, double vision, eye pain Ear, nose and throat: No hearing loss, ear pain, nasal congestion, sore throat Cardiovascular: No chest pain, palpitations Respiratory:  No shortness of breath at rest or with exertion.   No wheezes GastrointestinaI: No nausea, vomiting, diarrhea, abdominal pain, fecal incontinence Genitourinary:  No dysuria, urinary retention or frequency.  No nocturia. Musculoskeletal:  No neck pain, back pain Integumentary: No rash, pruritus, skin lesions Neurological: as above Psychiatric: No depression at this time.  No anxiety Endocrine: No palpitations, diaphoresis, change in appetite, change in weigh or increased thirst Hematologic/Lymphatic:  No anemia, purpura, petechiae. Allergic/Immunologic: No itchy/runny eyes, nasal congestion, recent allergic reactions, rashes  ALLERGIES: Allergies  Allergen Reactions   Aripiprazole Other (See Comments)    Insomnia, Night Sweats   Glatiramer Other (See Comments)    Injection site reaction   Nickel Itching and Rash   Codeine Itching   Fingolimod Other (See Comments)    HA/constipation   Lithium Itching   Ropinirole Other (See Comments)    Night sweats    HOME MEDICATIONS:  Current Outpatient Medications:    Multiple Vitamin (MULTI-VITAMIN PO), Take 1 tablet by mouth daily at 12 noon., Disp: , Rfl:    Probiotic Product (PROBIOTIC DAILY PO), Take 1 tablet  by mouth daily at 12 noon., Disp: , Rfl:    UNABLE TO FIND, Take 1-2 tablets by mouth daily. C Moss, Disp: , Rfl:   PAST MEDICAL HISTORY: Past Medical History:  Diagnosis Date   Anxiety 02/12/2017   Bilateral  knee pain 10/05/2017   Bipolar 1 disorder (HCC) 12/08/2016   Bulging of cervical intervertebral disc 06/24/2017   C4-5, C6-7   Chest pain of uncertain etiology 11/19/2021   Chronic back pain 10/05/2017   Clonus 05/27/2017   ankles   Degeneration of lumbar intervertebral disc 10/05/2017   Depression 01/04/2013   GAD (generalized anxiety disorder) 11/22/2019   Heart murmur    Hypothyroidism 12/08/2016   Migraine without aura and without status migrainosus, not intractable 02/12/2017   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   Mixed dyslipidemia 11/19/2021   Multiple sclerosis (HCC) 03/30/2009   Palpitations 11/19/2021   Restless legs syndrome (RLS) 12/08/2016   Sleeping difficulty 01/04/2013   Tremor 05/27/2017   Vertigo 04/29/2017    PAST SURGICAL HISTORY: Past Surgical History:  Procedure Laterality Date   WISDOM TOOTH EXTRACTION      FAMILY HISTORY: Family History  Problem Relation Age of Onset   Anxiety disorder Mother    Depression Mother    Cancer Mother    Migraines Mother    Heart attack Father    Coronary artery disease Father        Hx cardiac stents   Thyroid disease Sister    Healthy Sister    Stroke Maternal Aunt    Stroke Maternal Uncle    Stroke Paternal Aunt    Parkinsonism Paternal Uncle    Stroke Paternal Uncle    Anxiety disorder Maternal Grandmother    Heart failure Maternal Grandmother    Heart attack Paternal Grandmother    Stroke Paternal Grandfather     SOCIAL HISTORY: Social History   Socioeconomic History   Marital status: Single    Spouse name: Not on file   Number of children: Not on file   Years of education: Not on file   Highest education level: Not on file  Occupational History   Not on file  Tobacco Use   Smoking status: Former    Current packs/day: 0.00    Types: Cigarettes    Quit date: 2016    Years since quitting: 8.5    Passive exposure: Past   Smokeless tobacco: Never  Vaping Use    Vaping status: Former  Substance and Sexual Activity   Alcohol use: Not Currently   Drug use: Not Currently   Sexual activity: Not on file  Other Topics Concern   Not on file  Social History Narrative   Not on file   Social Determinants of Health   Financial Resource Strain: Not on file  Food Insecurity: Not on file  Transportation Needs: Not on file  Physical Activity: Not on file  Stress: Not on file  Social Connections: Not on file  Intimate Partner Violence: Not on file       PHYSICAL EXAM  There were no vitals filed for this visit.  There is no height or weight on file to calculate BMI.   General: The patient is well-developed and well-nourished and in no acute distress  HEENT:  Head is [place]/AT.  Sclera are anicteric.  Funduscopic exam shows normal optic discs and retinal vessels.  Neck: No carotid bruits are noted.  The neck is nontender.  Cardiovascular: The heart has a regular  rate and rhythm with a normal S1 and S2. There were no murmurs, gallops or rubs.    Skin: Extremities are without rash or  edema.  Musculoskeletal:  Back is nontender  Neurologic Exam  Mental status: The patient is alert and oriented x 3 at the time of the examination. The patient has apparent normal recent and remote memory, with an apparently normal attention span and concentration ability.   Speech is normal.  Cranial nerves: Extraocular movements are full. Pupils are equal, round, and reactive to light and accomodation.  Visual fields are full.  Facial symmetry is present. There is good facial sensation to soft touch bilaterally.Facial strength is normal.  Trapezius and sternocleidomastoid strength is normal. No dysarthria is noted.  The tongue is midline, and the patient has symmetric elevation of the soft palate. No obvious hearing deficits are noted.  Motor:  Muscle bulk is normal.   Tone is normal. Strength is  5 / 5 in all 4 extremities.   Sensory: Sensory testing is intact  to pinprick, soft touch and vibration sensation in all 4 extremities.  Coordination: Cerebellar testing reveals good finger-nose-finger and heel-to-shin bilaterally.  Gait and station: Station is normal.   Gait is normal. Tandem gait is normal. Romberg is negative.   Reflexes: Deep tendon reflexes are symmetric and normal bilaterally.   Plantar responses are flexor.    DIAGNOSTIC DATA (LABS, IMAGING, TESTING) - I reviewed patient records, labs, notes, testing and imaging myself where available.  No results found for: "WBC", "HGB", "HCT", "MCV", "PLT"    Component Value Date/Time   NA 142 02/09/2022 0000   K 4.7 02/09/2022 0000   CL 104 02/09/2022 0000   CO2 22 02/09/2022 0000   GLUCOSE 86 02/09/2022 0000   BUN 12 02/09/2022 0000   CREATININE 0.62 02/09/2022 0000   CALCIUM 9.6 02/09/2022 0000   PROT 7.3 02/09/2022 0000   ALBUMIN 4.9 02/09/2022 0000   AST 16 02/09/2022 0000   ALT 28 02/09/2022 0000   ALKPHOS 41 (L) 02/09/2022 0000   BILITOT 0.7 02/09/2022 0000   Lab Results  Component Value Date   CHOL 224 (H) 02/09/2022   HDL 97 02/09/2022   LDLCALC 117 (H) 02/09/2022   TRIG 55 02/09/2022   No results found for: "HGBA1C" No results found for: "VITAMINB12" No results found for: "TSH"     ASSESSMENT AND PLAN  ***    A. Epimenio Foot, MD, [facility] 10/25/2022, 10:12 AM Certified in Neurology, Clinical Neurophysiology, Sleep Medicine and Neuroimaging  [facility] [address], [address] [place], [place] [address] [phone]

## 2022-10-26 ENCOUNTER — Ambulatory Visit: Payer: Medicaid Other | Admitting: Neurology

## 2022-10-26 ENCOUNTER — Telehealth: Payer: Self-pay | Admitting: Neurology

## 2022-10-26 NOTE — Telephone Encounter (Signed)
Pt cancelled appt due to being at the Emergency Room. Informed pt to reschedule appt would need a reschedule. Pt verbalized understand.

## 2022-10-27 ENCOUNTER — Encounter: Payer: Medicaid Other | Admitting: Student

## 2022-11-04 ENCOUNTER — Encounter: Payer: Self-pay | Admitting: Anesthesiology

## 2022-12-29 ENCOUNTER — Encounter: Payer: Medicaid Other | Admitting: Student

## 2023-01-13 ENCOUNTER — Encounter: Payer: Self-pay | Admitting: Student

## 2023-01-13 ENCOUNTER — Ambulatory Visit: Payer: Medicaid Other | Admitting: Student

## 2023-01-13 VITALS — BP 100/68 | HR 85 | Temp 97.6°F | Resp 18 | Ht 62.0 in | Wt 136.5 lb

## 2023-01-13 DIAGNOSIS — N958 Other specified menopausal and perimenopausal disorders: Secondary | ICD-10-CM

## 2023-01-13 DIAGNOSIS — E782 Mixed hyperlipidemia: Secondary | ICD-10-CM | POA: Diagnosis not present

## 2023-01-13 DIAGNOSIS — N898 Other specified noninflammatory disorders of vagina: Secondary | ICD-10-CM

## 2023-01-13 DIAGNOSIS — Z6824 Body mass index (BMI) 24.0-24.9, adult: Secondary | ICD-10-CM

## 2023-01-13 DIAGNOSIS — Z0000 Encounter for general adult medical examination without abnormal findings: Secondary | ICD-10-CM

## 2023-01-13 DIAGNOSIS — F32A Depression, unspecified: Secondary | ICD-10-CM

## 2023-01-13 DIAGNOSIS — E039 Hypothyroidism, unspecified: Secondary | ICD-10-CM

## 2023-01-13 DIAGNOSIS — Z1231 Encounter for screening mammogram for malignant neoplasm of breast: Secondary | ICD-10-CM | POA: Insufficient documentation

## 2023-01-13 NOTE — Progress Notes (Signed)
Patient: Karen Leonard, Female    DOB: 1980/08/15, 42 y.o.   MRN: [id number] Edwena Blow, NP Visit Date: 01/13/2023   Chief Complaint  Patient presents with   Annual Exam    Annual exam. C/o: continuous yeast infections, discharge, white.   Subjective:   Annual physical exam:  Karen Leonard is a 42 y.o. female who presents today for annual health maintenance exam: She is due for the following health maintenance studies:  screening labs, colonoscopy, and cervical pap exam. She declines mammogram and flu shots.Today she is requesting her estrogen and testosterone levels for complaints of night sweats, hair growth, and vaginal discharge.   For exercise and activity the patient does not exercise as much as she used to due to back and knee pain.  Diet does not drink sodas and does not eat fast foods.  Smoke: none smoker  Alcohol screening: occasional  Eye Exam: 01/2022 wears glasses Dental Exam: 11/2022    SDOH Screenings   Depression (PHQ2-9): High Risk (01/13/2023)  Tobacco Use: Medium Risk (01/13/2023)     USPSTF grade A and B recommendations - reviewed and addressed today  Depression:  Phq 9 completed today by patient, was reviewed by me with patient in the room PHQ score is 16, pt feels that she is at her baseline today.   Flowsheet Row Office Visit from 01/13/2023 in [facility]  PHQ-9 Total Score 16       Immunizations and Health Maintenance: Health Maintenance  Topic Date Due   DTaP/Tdap/Td (1 - Tdap) Never done   Cervical Cancer Screening (HPV/Pap Cotest)  Never done   COVID-19 Vaccine (1 - 2023-24 season) 01/29/2023 (Originally 11/29/2022)   INFLUENZA VACCINE  06/28/2023 (Originally 10/29/2022)   HIV Screening  Completed   HPV VACCINES  Aged Out   Hepatitis C Screening  Discontinued      Hep C Screening: non reactive 2012  STD testing and prevention (HIV/chl/gon/syphilis):  see above, no additional testing desired by pt today.  Intimate  partner violence:no concern   Sexual History/Pain during Intercourse: Single  Menstrual History/LMP/Abnormal Bleeding:  Patient's last menstrual period was 12/30/2022 (approximate).  Incontinence Symptoms: no concerns   Breast cancer:  Last Mammogram: never done. Ordered at this visit.  BRCA gene screening: deneis   Cervical cancer screening: due her last was 2018 Pt no family hx of cancers - breast, ovarian, uterine, colon:     Osteoporosis:   Discussion on osteoporosis per age, including high calcium and vitamin D supplementation, weight bearing exercises Pt is not supplementing with daily calcium/Vit D.  Bone scan/dexa- n/a Roughly experienced menopause at age n/a  Skin cancer:  Hx of skin CA -  denies  Discussed atypical lesions   Colorectal cancer:   Colonoscopy is not recommended    Discussed concerning signs and sx of CRC, pt denies.  Lung cancer:   Low Dose CT Chest recommended if Age 48-80 years, 20 pack-year currently smoking OR have quit w/in 15years. Patient does not qualify.    Social History   Tobacco Use   Smoking status: Former    Current packs/day: 0.00    Types: Cigarettes    Quit date: 2016    Years since quitting: 8.7    Passive exposure: Past   Smokeless tobacco: Never  Vaping Use   Vaping status: Former  Substance Use Topics   Alcohol use: Not Currently   Drug use: Not Currently       Family History  Problem Relation Age of Onset   Anxiety disorder Mother    Depression Mother    Cancer Mother    Migraines Mother    Heart attack Father    Coronary artery disease Father        Hx cardiac stents   Thyroid disease Sister    Healthy Sister    Stroke Maternal Aunt    Stroke Maternal Uncle    Stroke Paternal Aunt    Parkinsonism Paternal Uncle    Stroke Paternal Uncle    Anxiety disorder Maternal Grandmother    Heart failure Maternal Grandmother    Heart attack Paternal Grandmother    Stroke Paternal Grandfather      Blood  pressure/Hypertension: BP Readings from Last 3 Encounters:  01/13/23 100/68  09/01/22 104/76  05/27/22 122/84     Social History       Social History   Socioeconomic History   Marital status: Single    Spouse name: Not on file   Number of children: Not on file   Years of education: Not on file   Highest education level: Not on file  Occupational History   Not on file  Tobacco Use   Smoking status: Former    Current packs/day: 0.00    Types: Cigarettes    Quit date: 2016    Years since quitting: 8.7    Passive exposure: Past   Smokeless tobacco: Never  Vaping Use   Vaping status: Former  Substance and Sexual Activity   Alcohol use: Not Currently   Drug use: Not Currently   Sexual activity: Not on file  Other Topics Concern   Not on file  Social History Narrative   Not on file   Social Determinants of Health   Financial Resource Strain: Not on file  Food Insecurity: Not on file  Transportation Needs: Not on file  Physical Activity: Not on file  Stress: Not on file  Social Connections: Not on file    Family History        Family History  Problem Relation Age of Onset   Anxiety disorder Mother    Depression Mother    Cancer Mother    Migraines Mother    Heart attack Father    Coronary artery disease Father        Hx cardiac stents   Thyroid disease Sister    Healthy Sister    Stroke Maternal Aunt    Stroke Maternal Uncle    Stroke Paternal Aunt    Parkinsonism Paternal Uncle    Stroke Paternal Uncle    Anxiety disorder Maternal Grandmother    Heart failure Maternal Grandmother    Heart attack Paternal Grandmother    Stroke Paternal Grandfather     Patient Active Problem List   Diagnosis Date Noted   Encounter for annual physical exam 01/13/2023   Other specified menopausal and perimenopausal disorders 01/13/2023   Heart murmur 11/19/2021   Palpitations 11/19/2021   Mixed dyslipidemia 11/19/2021   Chest pain of uncertain etiology 11/19/2021    GAD (generalized anxiety disorder) 11/22/2019   Bilateral knee pain 10/05/2017   Chronic back pain 10/05/2017   Degeneration of lumbar intervertebral disc 10/05/2017   Bulging of cervical intervertebral disc 06/24/2017   Clonus 05/27/2017   Tremor 05/27/2017   Episodic lightheadedness 04/29/2017   Anxiety 02/12/2017   Migraine without aura and without status migrainosus, not intractable 02/12/2017   Bipolar 1 disorder (HCC) 12/08/2016   Hypothyroidism 12/08/2016   Restless legs syndrome (  RLS) 12/08/2016   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 C Moss, Disp: , Rfl:   Allergies  Allergen Reactions   Aripiprazole Other (See Comments)    Insomnia, Night Sweats   Glatiramer Other (See Comments)    Injection site reaction   Nickel Itching and Rash   Codeine Itching   Fingolimod Other (See Comments)    HA/constipation   Lithium Itching   Magnesium-Containing Compounds Palpitations   Ropinirole Other (See Comments)    Night sweats    Patient Care Team: Michall Noffke, Luetta Nutting, NP as PCP - General (Internal Medicine)  Advanced Care Planning:  A voluntary discussion about advance care planning including the explanation and discussion of advance directives.   Discussed health care proxy and Living will, and the patient was able to identify a health care proxy as her family.   Patient does not have a living will at present time.    Review of Systems Review of Systems  All other systems reviewed and  are negative.         Objective:   Vitals:   BP 100/68 (BP Location: Left Arm, Patient Position: Sitting)   Pulse 85   Temp 97.6 F (36.4 C)   Resp 18   Ht 5\' 2"  (1.575 m)   Wt 136 lb 8 oz (61.9 kg)   LMP 12/30/2022 (Approximate)   SpO2 99%   BMI 24.97 kg/m    Physical Examination Physical Exam       Assessment & Plan:     Encounter for annual physical exam CPE completed today   USPSTF grade A and B recommendations reviewed with patient; age-appropriate recommendations, preventive care, screening tests, etc discussed and encouraged; healthy living encouraged; see AVS for patient education given to patient   Discussed importance of 150 minutes of physical activity weekly, AHA exercise recommendations given to pt in AVS/handout   Discussed importance of healthy diet:  eating lean meats and proteins, avoiding trans fats and saturated fats, avoid simple sugars and excessive carbs in diet, eat 6 servings of fruit/vegetables daily and drink plenty of water and avoid sweet beverages.     Recommended pt to do annual eye exam and routine dental exams/cleanings   Depression, alcohol, fall screening completed as documented above and per flowsheets   Advance Care planning information and packet discussed and offered today, encouraged pt to discuss with family members/spouse/partner/friends and complete Advanced directive packet and bring copy to office    Reviewed Health Maintenance:    No follow-ups on file.      Health Maintenance  Topic Date Due   DTaP/Tdap/Td (1 - Tdap) Never done   Cervical Cancer Screening (HPV/Pap Cotest)  Never done   COVID-19 Vaccine (1 - 2023-24 season) 01/29/2023 (Originally 11/29/2022)   INFLUENZA VACCINE  06/28/2023 (Originally 10/29/2022)   HIV Screening  Completed   HPV VACCINES  Aged Out   Hepatitis C Screening  Discontinued      Luisdaniel Kenton  L Ansley Stanwood, NP 01/13/23 4:51 PM

## 2023-01-13 NOTE — Assessment & Plan Note (Signed)
CPE completed today   USPSTF grade A and B recommendations reviewed with patient; age-appropriate recommendations, preventive care, screening tests, etc discussed and encouraged; healthy living encouraged; see AVS for patient education given to patient   Discussed importance of 150 minutes of physical activity weekly, AHA exercise recommendations given to pt in AVS/handout   Discussed importance of healthy diet:  eating lean meats and proteins, avoiding trans fats and saturated fats, avoid simple sugars and excessive carbs in diet, eat 6 servings of fruit/vegetables daily and drink plenty of water and avoid sweet beverages.     Recommended pt to do annual eye exam and routine dental exams/cleanings   Depression, alcohol, fall screening completed as documented above and per flowsheets   Advance Care planning information and packet discussed and offered today, encouraged pt to discuss with family members/spouse/partner/friends and complete Advanced directive packet and bring copy to office    Reviewed Health Maintenance:

## 2023-01-13 NOTE — Patient Instructions (Addendum)
annual eye exam and routine dental exams/cleanings.   Consider using a multivitamin.

## 2023-01-19 LAB — PAP IG, CT-NG TV HSV 1/2 NAA
Chlamydia, Nuc. Acid Amp: NEGATIVE
Gonococcus, Nuc. Acid Amp: NEGATIVE
HSV 1 NAA: NEGATIVE
HSV 2 NAA: NEGATIVE
PAP Smear Comment: 0
Trich vag by NAA: NEGATIVE

## 2023-01-19 NOTE — Progress Notes (Signed)
Please let her know the pap cervical exam was negative for lesion or malignancy, no STDs detected. We are awaiting all other blood work to return.

## 2023-01-20 LAB — CBC
Hematocrit: 43.7 % (ref 34.0–46.6)
Hemoglobin: 13.8 g/dL (ref 11.1–15.9)
MCH: 29.2 pg (ref 26.6–33.0)
MCHC: 31.6 g/dL (ref 31.5–35.7)
MCV: 93 fL (ref 79–97)
Platelets: 233 10*3/uL (ref 150–450)
RBC: 4.72 x10E6/uL (ref 3.77–5.28)
RDW: 11.7 % (ref 11.7–15.4)
WBC: 7.2 10*3/uL (ref 3.4–10.8)

## 2023-01-20 LAB — LIPID PANEL
Chol/HDL Ratio: 3 {ratio} (ref 0.0–4.4)
Cholesterol, Total: 267 mg/dL — ABNORMAL HIGH (ref 100–199)
HDL: 89 mg/dL (ref >39)
LDL Chol Calc (NIH): 170 mg/dL — ABNORMAL HIGH (ref 0–99)
Triglycerides: 52 mg/dL (ref 0–149)
VLDL Cholesterol Cal: 8 mg/dL (ref 5–40)

## 2023-01-20 LAB — TESTOSTERONE: Testosterone: 28 ng/dL (ref 8–60)

## 2023-01-20 LAB — PROGESTERONE: Progesterone: 6.4 ng/mL

## 2023-01-20 LAB — HEMOGLOBIN A1C
Est. average glucose Bld gHb Est-mCnc: 94 mg/dL
Hgb A1c MFr Bld: 4.9 % (ref 4.8–5.6)

## 2023-01-20 LAB — ESTROGENS, TOTAL: Estrogen: 230 pg/mL

## 2023-01-20 LAB — SPECIMEN STATUS REPORT

## 2023-01-20 LAB — VITAMIN D 25 HYDROXY (VIT D DEFICIENCY, FRACTURES): Vit D, 25-Hydroxy: 31.2 ng/mL (ref 30.0–100.0)

## 2023-01-20 LAB — TSH: TSH: 1.45 u[IU]/mL (ref 0.450–4.500)

## 2023-01-21 LAB — CMP14 + ANION GAP
ALT: 29 [IU]/L (ref 0–32)
AST: 16 [IU]/L (ref 0–40)
Albumin: 4.6 g/dL (ref 3.9–4.9)
Alkaline Phosphatase: 42 [IU]/L — ABNORMAL LOW (ref 44–121)
Anion Gap: 17 mmol/L (ref 10.0–18.0)
BUN/Creatinine Ratio: 14 (ref 9–23)
BUN: 8 mg/dL (ref 6–24)
Bilirubin Total: 0.4 mg/dL (ref 0.0–1.2)
CO2: 20 mmol/L (ref 20–29)
Calcium: 9.5 mg/dL (ref 8.7–10.2)
Chloride: 102 mmol/L (ref 96–106)
Creatinine, Ser: 0.59 mg/dL (ref 0.57–1.00)
Globulin, Total: 2.3 g/dL (ref 1.5–4.5)
Glucose: 86 mg/dL (ref 70–99)
Potassium: 5 mmol/L (ref 3.5–5.2)
Sodium: 139 mmol/L (ref 134–144)
Total Protein: 6.9 g/dL (ref 6.0–8.5)
eGFR: 117 mL/min/{1.73_m2} (ref >59)

## 2023-01-21 LAB — SPECIMEN STATUS REPORT

## 2023-01-26 ENCOUNTER — Ambulatory Visit: Payer: Medicaid Other | Admitting: Student

## 2023-02-03 ENCOUNTER — Ambulatory Visit: Payer: Medicaid Other | Admitting: Student

## 2024-04-07 ENCOUNTER — Ambulatory Visit

## 2024-04-07 VITALS — BP 112/78 | HR 68 | Temp 98.3°F | Resp 16 | Ht 62.0 in | Wt 156.6 lb

## 2024-04-07 DIAGNOSIS — G4700 Insomnia, unspecified: Secondary | ICD-10-CM | POA: Insufficient documentation

## 2024-04-07 DIAGNOSIS — F3289 Other specified depressive episodes: Secondary | ICD-10-CM

## 2024-04-07 DIAGNOSIS — Z0000 Encounter for general adult medical examination without abnormal findings: Secondary | ICD-10-CM

## 2024-04-07 DIAGNOSIS — F419 Anxiety disorder, unspecified: Secondary | ICD-10-CM

## 2024-04-07 DIAGNOSIS — Z1231 Encounter for screening mammogram for malignant neoplasm of breast: Secondary | ICD-10-CM

## 2024-04-07 MED ORDER — SERTRALINE HCL 50 MG PO TABS: 50.0000 mg | ORAL_TABLET | Freq: Every day | ORAL | 1 refills | Status: DC

## 2024-04-07 MED ORDER — HYDROXYZINE HCL 10 MG PO TABS: 10.0000 mg | ORAL_TABLET | Freq: Three times a day (TID) | ORAL | 0 refills | Status: AC | PRN

## 2024-04-07 NOTE — Assessment & Plan Note (Signed)
 Start Zoloft  25 mg x 1 week then 50 mg the next week.

## 2024-04-07 NOTE — Assessment & Plan Note (Addendum)
 Start Zoloft  and Atarax  daily for insomnia/anxiety and depression

## 2024-04-07 NOTE — Progress Notes (Signed)
 "  Established Patient Office Visit  Subjective   Patient ID: Karen Leonard, female    DOB: [birth year]  Age: 44 y.o. MRN: [id number]  No chief complaint on file.   Patient is a 44 year old female who presents in office today for an annual physical. 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 She has one daughter who is 2 years of age that is homeschooled. She has sisters who help however they work as well. She reports her father finally did get some help with home health. She has not had a mammogram. She is interested in alternative ways of being able to obtain it. She reports having multiple sclerosis.  She is a non smoker. She drinks socially. She reports a recent weight gain of 20 lbs. She reports her diet is sporadic and she can only eat once a day. She does consume snacks and practices intermittent fasting. She does not take flu or covid vaccines.      Review of Systems  Constitutional: Negative.   HENT: Negative.    Eyes: Negative.   Respiratory: Negative.    Cardiovascular: Negative.   Gastrointestinal: Negative.   Genitourinary: Negative.   Musculoskeletal: Negative.   Skin: Negative.   Neurological: Negative.   Psychiatric/Behavioral: Negative.        Objective:     There were no vitals taken for this visit.   Physical Exam Constitutional:      Appearance: Normal appearance.  HENT:     Nose: Nose normal.     Mouth/Throat:     Mouth: Mucous membranes are moist.  Cardiovascular:     Rate and Rhythm: Normal rate and regular rhythm.     Pulses: Normal pulses.     Heart sounds: Normal heart sounds.  Pulmonary:     Effort: Pulmonary effort is normal.     Breath sounds: Normal breath sounds.  Abdominal:     General: Abdomen is flat. Bowel sounds are normal.  Musculoskeletal:        General: Normal range of motion.     Cervical back: Normal range of motion.  Skin:    General: Skin is warm and dry.  Neurological:      General: No focal deficit present.     Mental Status: She is alert and oriented to person, place, and time.  Psychiatric:        Mood and Affect: Mood normal.        Behavior: Behavior normal.        Thought Content: Thought content normal.        Judgment: Judgment normal.      No results found for any visits on 04/07/24.    The 10-year ASCVD risk score (Arnett DK, et al., 2019) is: 0.1%    Assessment & Plan:   Problem List Items Addressed This Visit       Other   Anxiety   Start Zoloft  and Atarax  daily for insomnia/anxiety and depression      Relevant Medications   sertraline  (ZOLOFT ) 50 MG tablet   hydrOXYzine  (ATARAX ) 10 MG tablet   Depression   Start Zoloft  25 mg x 1 week then 50 mg the next week.      Relevant Medications   sertraline  (ZOLOFT ) 50 MG tablet   hydrOXYzine  (ATARAX ) 10 MG tablet   Screening mammogram for breast cancer - Primary   Insomnia   May take Atarax  for anxiety and insomnia  Relevant Medications   hydrOXYzine  (ATARAX ) 10 MG tablet    Return in about 2 months (around 06/05/2024) for follow up. Come sooner if need.    Austine Cork, FNP  "

## 2024-04-07 NOTE — Addendum Note (Signed)
 Addended by: Job Holtsclaw on: 04/07/2024 04:47 PM   Modules accepted: Orders

## 2024-04-07 NOTE — Assessment & Plan Note (Signed)
 May take Atarax  for anxiety and insomnia

## 2024-04-11 ENCOUNTER — Ambulatory Visit

## 2024-04-11 DIAGNOSIS — Z0000 Encounter for general adult medical examination without abnormal findings: Secondary | ICD-10-CM

## 2024-04-11 NOTE — Progress Notes (Signed)
 Nurse Visit Lab Draw CBC, A1C, CMP, Cortisol, Lipid, TSH

## 2024-04-12 LAB — HEMOGLOBIN A1C
Est. average glucose Bld gHb Est-mCnc: 94 mg/dL
Hgb A1c MFr Bld: 4.9 % (ref 4.8–5.6)

## 2024-04-12 LAB — CMP14 + ANION GAP
ALT: 34 IU/L — ABNORMAL HIGH (ref 0–32)
AST: 23 IU/L (ref 0–40)
Albumin: 4.4 g/dL (ref 3.9–4.9)
Alkaline Phosphatase: 39 IU/L — ABNORMAL LOW (ref 41–116)
Anion Gap: 13 mmol/L (ref 10.0–18.0)
BUN/Creatinine Ratio: 20 (ref 9–23)
BUN: 11 mg/dL (ref 6–24)
Bilirubin Total: 0.7 mg/dL (ref 0.0–1.2)
CO2: 22 mmol/L (ref 20–29)
Calcium: 8.8 mg/dL (ref 8.7–10.2)
Chloride: 104 mmol/L (ref 96–106)
Creatinine, Ser: 0.56 mg/dL — ABNORMAL LOW (ref 0.57–1.00)
Globulin, Total: 2.5 g/dL (ref 1.5–4.5)
Glucose: 79 mg/dL (ref 70–99)
Potassium: 4.7 mmol/L (ref 3.5–5.2)
Sodium: 139 mmol/L (ref 134–144)
Total Protein: 6.9 g/dL (ref 6.0–8.5)
eGFR: 118 mL/min/1.73

## 2024-04-12 LAB — CBC WITH DIFFERENTIAL/PLATELET
Basophils Absolute: 0.1 x10E3/uL (ref 0.0–0.2)
Basos: 1 %
EOS (ABSOLUTE): 0.3 x10E3/uL (ref 0.0–0.4)
Eos: 5 %
Hematocrit: 41.7 % (ref 34.0–46.6)
Hemoglobin: 13.3 g/dL (ref 11.1–15.9)
Immature Grans (Abs): 0 x10E3/uL (ref 0.0–0.1)
Immature Granulocytes: 0 %
Lymphocytes Absolute: 1.7 x10E3/uL (ref 0.7–3.1)
Lymphs: 29 %
MCH: 29.1 pg (ref 26.6–33.0)
MCHC: 31.9 g/dL (ref 31.5–35.7)
MCV: 91 fL (ref 79–97)
Monocytes Absolute: 0.4 x10E3/uL (ref 0.1–0.9)
Monocytes: 6 %
Neutrophils Absolute: 3.4 x10E3/uL (ref 1.4–7.0)
Neutrophils: 59 %
Platelets: 194 x10E3/uL (ref 150–450)
RBC: 4.57 x10E6/uL (ref 3.77–5.28)
RDW: 11.9 % (ref 11.7–15.4)
WBC: 5.8 x10E3/uL (ref 3.4–10.8)

## 2024-04-12 LAB — LIPID PANEL
Chol/HDL Ratio: 2.3 ratio (ref 0.0–4.4)
Cholesterol, Total: 220 mg/dL — ABNORMAL HIGH (ref 100–199)
HDL: 94 mg/dL
LDL Chol Calc (NIH): 118 mg/dL — ABNORMAL HIGH (ref 0–99)
Triglycerides: 43 mg/dL (ref 0–149)
VLDL Cholesterol Cal: 8 mg/dL (ref 5–40)

## 2024-04-12 LAB — TSH: TSH: 1.93 u[IU]/mL (ref 0.450–4.500)

## 2024-04-12 LAB — CORTISOL: Cortisol: 8.8 ug/dL (ref 6.2–19.4)

## 2024-04-30 ENCOUNTER — Other Ambulatory Visit: Payer: Self-pay

## 2024-04-30 DIAGNOSIS — F419 Anxiety disorder, unspecified: Secondary | ICD-10-CM

## 2024-04-30 DIAGNOSIS — F32A Depression, unspecified: Secondary | ICD-10-CM

## 2024-05-03 ENCOUNTER — Other Ambulatory Visit: Payer: Self-pay

## 2024-05-03 DIAGNOSIS — F32A Depression, unspecified: Secondary | ICD-10-CM

## 2024-05-03 DIAGNOSIS — F419 Anxiety disorder, unspecified: Secondary | ICD-10-CM

## 2024-06-02 ENCOUNTER — Ambulatory Visit
